# Patient Record
Sex: Male | Born: 1970 | Hispanic: Refuse to answer | Marital: Single | Smoking: Former smoker
Health system: Southern US, Community
[De-identification: ages and names within clinical notes are randomized; demographics above are authoritative.]

## PROBLEM LIST (undated history)

## (undated) DIAGNOSIS — E785 Hyperlipidemia, unspecified: Secondary | ICD-10-CM

## (undated) HISTORY — DX: Hyperlipidemia, unspecified: E78.5

---

## 2010-07-31 ENCOUNTER — Institutional Professional Consult (permissible substitution): Payer: Self-pay | Admitting: Internal Medicine

## 2010-08-19 ENCOUNTER — Telehealth: Payer: Self-pay | Admitting: Internal Medicine

## 2010-08-19 ENCOUNTER — Ambulatory Visit (INDEPENDENT_AMBULATORY_CARE_PROVIDER_SITE_OTHER)
Admission: RE | Admit: 2010-08-19 | Discharge: 2010-08-19 | Disposition: A | Discharge status: Home / Self Care | Payer: 59 | Source: Ambulatory Visit | Attending: Internal Medicine | Admitting: Internal Medicine

## 2010-08-19 ENCOUNTER — Ambulatory Visit (INDEPENDENT_AMBULATORY_CARE_PROVIDER_SITE_OTHER): Payer: 59 | Admitting: Internal Medicine

## 2010-08-19 ENCOUNTER — Encounter: Payer: Self-pay | Admitting: Internal Medicine

## 2010-08-19 ENCOUNTER — Institutional Professional Consult (permissible substitution): Payer: Self-pay | Admitting: Pulmonary Disease

## 2010-08-19 VITALS — BP 124/66 | HR 108 | Temp 98.5°F | Ht 71.0 in | Wt 193.0 lb

## 2010-08-19 DIAGNOSIS — R0609 Other forms of dyspnea: Secondary | ICD-10-CM

## 2010-08-19 DIAGNOSIS — R0600 Dyspnea, unspecified: Secondary | ICD-10-CM

## 2010-08-19 DIAGNOSIS — J45909 Unspecified asthma, uncomplicated: Secondary | ICD-10-CM

## 2010-08-19 DIAGNOSIS — R0989 Other specified symptoms and signs involving the circulatory and respiratory systems: Secondary | ICD-10-CM

## 2010-08-19 DIAGNOSIS — J4540 Moderate persistent asthma, uncomplicated: Secondary | ICD-10-CM | POA: Insufficient documentation

## 2010-08-19 NOTE — Assessment & Plan Note (Addendum)
In retrospect this is most likely longstanding asthma with one episode of  significant work related exacerbation only when he failed to keep his repirator on.  The fv curves are classic for variable  airflow obstruction, there is nl dlco and fev1/vc ratio's are intermittently nl so this is better characterized as chronic asthma than copd  Rec start a maintenance inhaler wiith Qvar 80 Take 2 puffs first thing in am and then another 2 puffs about 12 hours later.    then return for repeat pft's

## 2010-08-19 NOTE — Telephone Encounter (Signed)
OV and PFT scheduled for 09/17/2010. PFT at 9 am and OV with MW at 10 am.

## 2010-08-19 NOTE — Telephone Encounter (Signed)
Please advise Dr. Sherene Sires if okay for pt to follow up in 4 weeks instead of 2 weeks. Pt doesn't want to come in for 2 different visits for PFT and then OV. Thanks  Carver Fila, CMA

## 2010-08-19 NOTE — Progress Notes (Signed)
Subjective:    Patient ID: Willie Coffey, male    DOB: 1970-11-29, 40 y.o.   MRN: [id number]  HPI  63 yobm quit smoking 2002 with no problems at all started working at [employer] in [facility] 2008 with annual spirometry.  08/19/2010 Initial pulmonary office eval in EMR era cc doe x 1 year not progressive, steps most noticeable at work but not home , then failed the workplace spirometry at [employer] >   [facility] "ok to wear respirator".  No cough. Last [facility] eval in November 2011.  Month ago used albuterol but really no better ex tol with it vs without it.   No noct complaints no cough or throat / sinus issues  Pt denies any significant sore throat, dysphagia, itching, sneezing,  nasal congestion or excess/ purulent secretions,  fever, chills, sweats, unintended wt loss, pleuritic or exertional cp, hempoptysis, orthopnea pnd or leg swelling.    Also denies any obvious fluctuation of symptoms with weather or environmental changes or other aggravating or alleviating factors.    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    PMHx New onset doe 2011       -Spirometry 3/92  FEV1  2.08 (56%) ratio 70     - Spirometry 2008  FEV1 2.19 ( 56%)       - Spirometry 07/23/10 FEV1 1.83 (52%) ratio 63 and nl dlco with 21% better p B2 with FRC  70%      - Spirometry  08/19/2010  FEV1 2.47 ( 61%)  with ratio 70 but concave FV curve    Review of Systems  Constitutional: Negative for fever, chills, activity change, appetite change and unexpected weight change.  HENT: Negative for congestion, sore throat, rhinorrhea, sneezing, trouble swallowing, dental problem, voice change and postnasal drip.   Eyes: Negative for visual disturbance.  Respiratory:  Negative for cough, choking and shortness of breath.   Cardiovascular: Negative for chest pain and leg swelling.  Gastrointestinal: Negative for nausea, vomiting and abdominal pain.  Genitourinary: Negative for difficulty urinating.  Musculoskeletal: Negative for arthralgias.  Skin: Negative for rash.  Psychiatric/Behavioral: Negative for behavioral problems and confusion.       Objective:   Physical Exam    pleasant athletic appearing muscular bm nad  Wt 08/19/2010  = 193 who had mod difficulty answering question related to sob  HEENT: nl dentition, turbinates, and orophanx. Nl external ear canals without cough reflex   NECK :  without JVD/Nodes/TM/ nl carotid upstrokes bilaterally   LUNGS: no acc muscle use, clear to A and P bilaterally without cough on insp or exp maneuvers   CV:  RRR  no s3 or murmur or increase in P2, no edema   ABD:  soft and nontender with nl excursion in the supine position. No bruits or organomegaly, bowel sounds nl  MS:  warm without deformities, calf tenderness, cyanosis or clubbing  SKIN: warm and dry without lesions    NEURO:  alert, approp, no deficits    cxr 08/19/2010  Findings: Midline trachea. Normal heart size and mediastinal  contours. Blunting of the left costophrenic angle is likely due to  minimal pleural thickening on the frontal. No pneumothorax. Clear  lungs.  IMPRESSION:  No  acute cardiopulmonary disease.   Assessment & Plan:

## 2010-08-19 NOTE — Telephone Encounter (Signed)
lmomtcb x1 to set up apt and pft for same day

## 2010-08-19 NOTE — Patient Instructions (Addendum)
You appear to have smaller lung volumes than normal on our sprimeter but this is only a preliminary finding and we should be able to correlate this with your cxr which I will call you with and do the formal lung volume test when you return.  In the meantime I see no reason why you can't wear the respirator when you are exposed to heavy fumes dusts or irritants at work  Late Add   Discussed by phone Start qvar 80 Take 2 puffs first thing in am and then another 2 puffs about 12 hours later.    and return with old xrays and pft's May 23rd as planned

## 2010-08-19 NOTE — Telephone Encounter (Signed)
Ok to do single summary ov

## 2010-08-21 MED ORDER — BECLOMETHASONE DIPROPIONATE 80 MCG/ACT IN AERS: INHALATION_SPRAY | RESPIRATORY_TRACT | Status: DC

## 2010-09-05 ENCOUNTER — Encounter: Payer: Self-pay | Admitting: Internal Medicine

## 2010-09-17 ENCOUNTER — Encounter: Payer: Self-pay | Admitting: Internal Medicine

## 2010-09-17 ENCOUNTER — Ambulatory Visit (INDEPENDENT_AMBULATORY_CARE_PROVIDER_SITE_OTHER): Payer: 59 | Admitting: Internal Medicine

## 2010-09-17 DIAGNOSIS — J45909 Unspecified asthma, uncomplicated: Secondary | ICD-10-CM

## 2010-09-17 LAB — PULMONARY FUNCTION TEST

## 2010-09-17 MED ORDER — MOMETASONE FURO-FORMOTEROL FUM 200-5 MCG/ACT IN AERO: INHALATION_SPRAY | RESPIRATORY_TRACT | Status: DC

## 2010-09-17 NOTE — Progress Notes (Signed)
PFT done today. 

## 2010-09-17 NOTE — Patient Instructions (Signed)
Start dulera 200 Take 2 puffs first thing in am and then another 2 puffs about 12 hours later.    Please schedule a follow up office visit in 4 weeks, sooner if needed

## 2010-09-17 NOTE — Progress Notes (Signed)
Subjective:    Patient ID: Willie Coffey, male    DOB: 1970/10/22, 40 y.o.   MRN: [id number]  HPI  26 yobm quit smoking 2002 with no problems at all started working at [employer] in [facility] 2008 with annual spirometry.  08/19/2010 Initial pulmonary office eval in EMR era cc doe x 1 year not progressive, steps most noticeable at work but not home , then failed the workplace spirometry at [employer] >   [facility] "ok to wear respirator".  No cough. Last [facility] eval in November 2011.  Month ago used albuterol but really no better ex tol with it vs without it.   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   Rec:   You appear to have smaller lung volumes than normal on our sprimeter but this is only a preliminary finding and we should be able to correlate this with your cxr which I will call you with and do the formal lung volume test when you return.  In the meantime I see no reason why you can't wear the respirator when you are exposed to heavy fumes dusts or irritants at work  Late Add   Discussed by phone Start qvar 80 Take 2 puffs first thing in am and then another 2 puffs about 12 hours later     09/17/2010 ov/Willie Coffey cc doe x steps only, otherwise dose fine  Sleeping ok without nocturnal  or early am exac of resp c/o's or need for noct saba. Pt denies any significant sore throat, dysphagia, itching, sneezing,  nasal congestion or excess/ purulent secretions,  fever, chills, sweats, unintended wt loss, pleuritic or exertional cp, hempoptysis, orthopnea pnd or leg swelling.    Also denies any obvious fluctuation of symptoms with weather or environmental changes or other  aggravating or alleviating factors.      PMHx New onset doe 2011       -Spirometry 3/92  FEV1  2.08 (56%) ratio 70     -  Spirometry 2008  FEV1 2.19 ( 56%)       - Spirometry   07/23/10 FEV1 1.83 (52%) ratio 63 and nl dlco with 21% better p B2 with FRC  70%      - Spirometry  08/19/2010  FEV1 2.47 ( 61%)  with ratio 70 but concave FV curve       - Spirometry  09/17/10 FEV1 2.20 (54%) with ratio 66 and DLCO 93% but 12 % better p B2       - HFA 75% p coaching 09/17/10           Objective:   Physical Exam    pleasant athletic appearing muscular bm nad    Wt 08/19/2010  = 193  Vs 193 09/17/2010   who had mod difficulty answering question related to sob  HEENT: nl dentition, turbinates, and orophanx. Nl external ear canals without cough reflex   NECK :  without JVD/Nodes/TM/ nl carotid upstrokes bilaterally   LUNGS: no acc muscle use, clear to A and P bilaterally without cough on insp or exp maneuvers   CV:  RRR  no s3 or murmur or increase in P2, no  edema   ABD:  soft and nontender with nl excursion in the supine position. No bruits or organomegaly, bowel sounds nl  MS:  warm without deformities, calf tenderness, cyanosis or clubbing       cxr 08/19/2010  Findings: Midline trachea. Normal heart size and mediastinal  contours. Blunting of the left costophrenic angle is likely due to  minimal pleural thickening on the frontal. No pneumothorax. Clear  lungs.  IMPRESSION:  No acute cardiopulmonary disease.   Assessment & Plan:

## 2010-09-18 ENCOUNTER — Encounter: Payer: Self-pay | Admitting: Internal Medicine

## 2010-09-18 NOTE — Assessment & Plan Note (Addendum)
PFT's nl p bronchodilator x for borderline reduction in lung vol and frc so he has chronic mod asthma by definition that is intermittently symptomatic but needs step up therapy from qvar since failed to normalize  lung function to nl on qvar 80 2bid though symptoms did improve so try dulera 200 2bid x one month trial  DDX of  difficult airways managment all start with A and  include Adherence, Ace Inhibitors, Acid Reflux, Active Sinus Disease, Alpha 1 Antitripsin deficiency, Anxiety masquerading as Airways dz,  ABPA,  allergy(esp in young), Aspiration (esp in elderly), Adverse effects of DPI,  Active smokers, plus two Bs  = Bronchiectasis and Beta blocker use..and one C= CHF  Adherence is always the initial "prime suspect" and is a multilayered concern that requires a "trust but verify" approach in every patient - starting with knowing how to use medications, especially inhalers, correctly, keeping up with refills and understanding the fundamental difference between maintenance and prns vs those medications only taken for a very short course and then stopped and not refilled.    Each maintenance medication was reviewed in detail including most importantly the difference between maintenance and as needed and under what circumstances the prns are to be used.  Please see instructions for details which were reviewed in writing and the patient given a copy.  The proper method of use, as well as anticipated side effects, of this metered-dose inhaler are discussed and demonstrated to the patient. Improved to 75% with coaching

## 2010-09-26 ENCOUNTER — Encounter: Payer: Self-pay | Admitting: Internal Medicine

## 2010-10-30 ENCOUNTER — Ambulatory Visit: Payer: 59 | Admitting: Internal Medicine

## 2010-11-19 ENCOUNTER — Ambulatory Visit: Payer: 59 | Admitting: Internal Medicine

## 2011-01-01 ENCOUNTER — Encounter: Payer: Self-pay | Admitting: Internal Medicine

## 2011-01-01 ENCOUNTER — Ambulatory Visit (INDEPENDENT_AMBULATORY_CARE_PROVIDER_SITE_OTHER): Payer: 59 | Admitting: Internal Medicine

## 2011-01-01 DIAGNOSIS — J45909 Unspecified asthma, uncomplicated: Secondary | ICD-10-CM

## 2011-01-01 DIAGNOSIS — J449 Chronic obstructive pulmonary disease, unspecified: Secondary | ICD-10-CM

## 2011-01-01 NOTE — Progress Notes (Signed)
Subjective:    Patient ID: Willie Coffey, male    DOB: 30-Aug-1970, 40 y.o.   MRN: [id number]  HPI  80 yobm quit smoking 2002 with no problems at all started working at [employer] in [facility] 2008 with annual spirometry.  08/19/2010 Initial pulmonary office eval in EMR era cc doe x 1 year not progressive, steps most noticeable at work but not home , then failed the workplace spirometry at [employer] >   [facility] "ok to wear respirator".  No cough. Last [facility] eval in November 2011.  Month ago used albuterol but really no better ex tol with it vs without it.   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   Rec:  You appear to have smaller lung volumes than normal on our sprimeter but this is only a preliminary finding and we should be able to correlate this with your cxr which I will call you with and do the formal lung volume test when you return. In the meantime I see no reason why you can't wear the respirator when you are exposed to heavy fumes dusts or irritants at work Late Add  Discussed by phone Start qvar 80 Take 2 puffs first thing in am and then another 2 puffs about 12 hours later     09/17/2010 ov/Willie Coffey cc doe x steps only, otherwise dose fine  Sleeping ok without nocturnal  or early am exac of resp c/o's or need for noct saba. rec  Start dulera 200 Take 2 puffs first thing in am and then another 2 puffs about 12 hours later   01/01/2011 f/u ov/Willie Coffey cc works with respirator twice weekly 30 min at a time with no problems at all. No cough or sob  Sleeping ok without nocturnal  or early am exac of resp c/o's or need for noct saba.   Pt denies any significant sore throat,  dysphagia, itching, sneezing,  nasal congestion or excess/ purulent secretions,  fever, chills, sweats, unintended wt loss, pleuritic or exertional cp, hempoptysis, orthopnea pnd or leg swelling.    Also denies any obvious fluctuation of symptoms with weather or environmental changes or other aggravating or alleviating factors.      PMHx New onset doe 2011       -Spirometry 3/92  FEV1  2.08 (56%) ratio 70     -  Spirometry 2008  FEV1 2.19 ( 56%)       - Spirometry   07/23/10 FEV1 1.83 (52%) ratio 63 and nl dlco with 21% better p B2 with FRC  70%      - Spirometry  08/19/2010  FEV1 2.47 ( 61%)  with ratio 70 but concave FV curve       - Spirometry  09/17/10 FEV1 2.20 (54%) with ratio 66 and DLCO 93% but 12 % better p B2       - HFA 75% p coaching 09/17/10           Objective:   Physical Exam    pleasant athletic appearing muscular bm nad    Wt 08/19/2010  = 193  Vs 193 09/17/2010  > 01/01/2011  190  who had mod difficulty answering  question related to sob  HEENT: nl dentition, turbinates, and orophanx. Nl external ear canals without cough reflex   NECK :  without JVD/Nodes/TM/ nl carotid upstrokes bilaterally   LUNGS: no acc muscle use, clear to A and P bilaterally without cough on insp or exp maneuvers   CV:  RRR  no s3 or murmur or increase in P2, no edema   ABD:  soft and nontender with nl excursion in the supine position. No bruits or organomegaly, bowel sounds nl  MS:  warm without deformities, calf tenderness, cyanosis or clubbing       cxr 08/19/2010  Findings: Midline trachea. Normal heart size and mediastinal  contours. Blunting of the left costophrenic angle is likely due to  minimal pleural thickening on the frontal. No pneumothorax. Clear  lungs.  IMPRESSION:  No acute cardiopulmonary disease.   Assessment & Plan:

## 2011-01-01 NOTE — Patient Instructions (Signed)
No change in dulera 200 Take 2 puffs first thing in am and then another 2 puffs about 12 hours later.     Please schedule a follow up visit in 3 months but call sooner if needed with PFT's

## 2011-01-02 ENCOUNTER — Encounter: Payer: Self-pay | Admitting: Internal Medicine

## 2011-01-02 NOTE — Assessment & Plan Note (Signed)
All goals of chronic asthma control met including optimal function and elimination of symptoms with minimal need for rescue therapy.  Contingencies discussed in full including contacting this office immediately if not controlling the symptoms using the rule of two's.    

## 2011-03-02 ENCOUNTER — Telehealth: Payer: Self-pay | Admitting: Internal Medicine

## 2011-03-02 ENCOUNTER — Other Ambulatory Visit: Payer: Self-pay | Admitting: *Deleted

## 2011-03-02 MED ORDER — MOMETASONE FURO-FORMOTEROL FUM 200-5 MCG/ACT IN AERO: INHALATION_SPRAY | RESPIRATORY_TRACT | Status: DC

## 2011-03-02 NOTE — Telephone Encounter (Signed)
lmomtcb  

## 2011-03-02 NOTE — Telephone Encounter (Signed)
Spoke with pt and notified that rx was sent to pharm. He is due next month for rov with PFT's, I asked if we could go ahead and schedule this and he states will have to call back and do this.

## 2011-03-02 NOTE — Telephone Encounter (Signed)
Original rx did not go through electronically. Called in refill. Carron Curie, CMA

## 2011-03-02 NOTE — Telephone Encounter (Signed)
Pt returned the call & can now be reached at [phone].  Willie Coffey

## 2011-04-17 ENCOUNTER — Encounter: Payer: Self-pay | Admitting: Internal Medicine

## 2011-04-17 ENCOUNTER — Ambulatory Visit (INDEPENDENT_AMBULATORY_CARE_PROVIDER_SITE_OTHER): Payer: 59 | Admitting: Internal Medicine

## 2011-04-17 DIAGNOSIS — J45909 Unspecified asthma, uncomplicated: Secondary | ICD-10-CM

## 2011-04-17 MED ORDER — MOMETASONE FURO-FORMOTEROL FUM 200-5 MCG/ACT IN AERO: INHALATION_SPRAY | RESPIRATORY_TRACT | Status: DC

## 2011-04-17 MED ORDER — ALBUTEROL SULFATE HFA 108 (90 BASE) MCG/ACT IN AERS: 2.0000 | INHALATION_SPRAY | Freq: Four times a day (QID) | RESPIRATORY_TRACT | Status: DC | PRN

## 2011-04-17 NOTE — Progress Notes (Signed)
Subjective:    Patient ID: Willie Coffey, male    DOB: 07/28/1970, 40 y.o.   MRN: [id number]  HPI  72 yobm quit smoking 2002 with no problems at all started working at [employer] in [facility] 2008 with annual spirometry.  08/19/2010 Initial pulmonary office eval in EMR era cc doe x 1 year not progressive, steps most noticeable at work but not home , then failed the workplace spirometry at [employer] >   [facility] "ok to wear respirator".  No cough. Last [facility] eval in November 2011.  Month ago used albuterol but really no better ex tol with it vs without it.   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   Rec:  You appear to have smaller lung volumes than normal on our sprimeter but this is only a preliminary finding and we should be able to correlate this with your cxr which I will call you with and do the formal lung volume test when you return. In the meantime I see no reason why you can't wear the respirator when you are exposed to heavy fumes dusts or irritants at work Late Add  Discussed by phone Start qvar 80 Take 2 puffs first thing in am and then another 2 puffs about 12 hours later     09/17/2010 ov/Willie Coffey cc doe x steps only, otherwise dose fine  Sleeping ok without nocturnal  or early am exac of resp c/o's or need for noct saba. rec  Start dulera 200 Take 2 puffs first thing in am and then another 2 puffs about 12 hours later   01/01/2011 f/u ov/Willie Coffey cc works with respirator twice weekly 30 min at a time with no problems at all. No cough or sob rec No change in dulera 200 Take 2 puffs first thing in am and then another 2 puffs about 12 hours later.      04/17/2011 f/u  ov/Willie Coffey still taking dulera 200 2bid and rarely need ventiolin, no cough.  Sleeping ok without nocturnal  or early am exac of resp c/o's or need for noct saba.   Pt denies any significant sore throat, dysphagia, itching, sneezing,  nasal congestion or excess/ purulent secretions,  fever, chills, sweats, unintended wt loss, pleuritic or exertional cp, hempoptysis, orthopnea pnd or leg swelling.    Also denies any obvious fluctuation of symptoms with weather or environmental changes or other aggravating or alleviating factors.      PMHx New onset doe 2011       -Spirometry 3/92  FEV1  2.08 (56%) ratio 70     -  Spirometry 2008  FEV1 2.19 ( 56%)       - Spirometry   07/23/10 FEV1 1.83 (52%) ratio 63 and nl dlco with 21% better p B2 with FRC  70%      - Spirometry  08/19/2010  FEV1 2.47 ( 61%)  with ratio 70 but concave FV curve       - Spirometry  09/17/10 FEV1 2.20 (54%) with ratio 66 and DLCO 93% but 12 % better p B2        -Spiriometry 04/17/2011     2.40 (59%) with ratio 70  and DLCO 88%       - HFA 75% p coaching 09/17/10           Objective:   Physical Exam    pleasant athletic appearing muscular bm nad    Wt 08/19/2010  = 193  Vs 193 09/17/2010  > 01/01/2011  190>   04/17/2011  192  who had mod difficulty answering question related to sob  HEENT: nl dentition, turbinates, and orophanx. Nl external ear canals without cough reflex   NECK :  without JVD/Nodes/TM/ nl carotid upstrokes bilaterally   LUNGS: no acc muscle use, clear to A and P bilaterally without cough on insp or exp maneuvers   CV:  RRR  no s3 or murmur or increase in P2, no edema   ABD:  soft and nontender with nl excursion in the supine position. No bruits or organomegaly, bowel sounds nl  MS:  warm without deformities, calf tenderness, cyanosis or clubbing       cxr 08/19/2010  Findings: Midline trachea. Normal heart size and mediastinal  contours. Blunting of the left costophrenic angle is likely due to    minimal pleural thickening on the frontal. No pneumothorax. Clear  lungs.  IMPRESSION:  No acute cardiopulmonary disease.   Assessment & Plan:

## 2011-04-17 NOTE — Progress Notes (Signed)
PFT done today. 

## 2011-04-17 NOTE — Patient Instructions (Addendum)
You need yearly re-evaluation each November, sooner if need for breathing problems or coughing  Only use your albuterol(ventolin) as a rescue medication to be used if you can't catch your breath by resting or doing a relaxed purse lip breathing pattern. The less you use it, the better it will work when you need it. Ideally you should not need this more than twice a week

## 2011-04-19 NOTE — Assessment & Plan Note (Signed)
All goals of chronic asthma control met including optimal function and elimination of symptoms with minimal need for rescue therapy.  Contingencies discussed in full including contacting this office immediately if not controlling the symptoms using the rule of two's.       Each maintenance medication was reviewed in detail including most importantly the difference between maintenance and as needed and under what circumstances the prns are to be used.  Please see instructions for details which were reviewed in writing and the patient given a copy.   

## 2011-08-31 ENCOUNTER — Telehealth: Payer: Self-pay | Admitting: Internal Medicine

## 2011-08-31 MED ORDER — MOMETASONE FURO-FORMOTEROL FUM 200-5 MCG/ACT IN AERO: INHALATION_SPRAY | RESPIRATORY_TRACT | Status: DC

## 2011-08-31 NOTE — Telephone Encounter (Signed)
I spoke with pt and states he needed a refill on his dulera. I have sent in rx and nothing further was needed

## 2012-04-06 ENCOUNTER — Encounter: Payer: Self-pay | Admitting: Internal Medicine

## 2012-04-06 ENCOUNTER — Ambulatory Visit (INDEPENDENT_AMBULATORY_CARE_PROVIDER_SITE_OTHER): Payer: 59 | Admitting: Internal Medicine

## 2012-04-06 VITALS — BP 102/64 | HR 88 | Temp 97.9°F | Ht 70.0 in | Wt 201.0 lb

## 2012-04-06 DIAGNOSIS — J45909 Unspecified asthma, uncomplicated: Secondary | ICD-10-CM

## 2012-04-06 MED ORDER — MOMETASONE FURO-FORMOTEROL FUM 200-5 MCG/ACT IN AERO: INHALATION_SPRAY | RESPIRATORY_TRACT | Status: DC

## 2012-04-06 NOTE — Progress Notes (Signed)
Subjective:    Patient ID: Willie Coffey, male    DOB: 1970-10-30    MRN: [id number]  HPI  41 yobm quit smoking 2002 with no problems at all started working at [employer] in [facility] 2008 with annual spirometry.  08/19/2010 Initial pulmonary office eval in EMR era cc doe x 1 year not progressive, steps most noticeable at work but not home , then failed the workplace spirometry at [employer] >   [facility] "ok to wear respirator".  No cough. Last [facility] eval in November 2011.  Month ago used albuterol but really no better ex tol with it vs without it.   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   Rec:  You appear to have smaller lung volumes than normal on our spirometer but this is only a preliminary finding and we should be able to correlate this with your cxr which I will call you with and do the formal lung volume test when you return. In the meantime I see no reason why you can't wear the respirator when you are exposed to heavy fumes dusts or irritants at work Late Add  Discussed by phone Start qvar 80 Take 2 puffs first thing in am and then another 2 puffs about 12 hours later     09/17/2010 ov/Willie Coffey cc doe x steps only, otherwise dose fine  Sleeping ok without nocturnal  or early am exac of resp c/o's or need for noct saba. rec  Start dulera 200 Take 2 puffs first thing in am and then another 2 puffs about 12 hours later   01/01/2011 f/u ov/Willie Coffey cc works with respirator twice weekly 30 min at a time with no problems at all. No cough or sob rec No change in dulera 200 Take 2 puffs first thing in am and then another 2 puffs about 12 hours later.      04/17/2011 f/u ov/Willie Coffey still  taking dulera 200 2bid and rarely need ventiolin, no cough. rec You need yearly re-evaluation each November, sooner if need for breathing problems or coughing Only use your albuterol(ventolin) as a rescue medication to be used if you can't catch your breath     04/06/2012 f/u ov/Willie Coffey cc not at all limited by breathing on dulera 200 2 bid and due repeat spirometry in jan 2014  No obvious daytime variabilty or assoc chronic cough or cp or chest tightness, subjective wheeze overt sinus or hb symptoms. No unusual exp hx or h/o childhood pna/ asthma or premature birth to his knowledge.    Sleeping ok without nocturnal  or early am exacerbation  of respiratory  c/o's or need for noct saba. Also denies any obvious fluctuation of symptoms with weather or environmental changes or other aggravating or alleviating factors except as outlined above   ROS  The following are not active complaints unless bolded sore throat, dysphagia, dental problems, itching, sneezing,  nasal congestion or excess/ purulent secretions, ear ache,   fever, chills, sweats, unintended wt loss, pleuritic or exertional cp, hemoptysis,  orthopnea pnd or leg swelling, presyncope, palpitations, heartburn, abdominal pain, anorexia, nausea, vomiting, diarrhea  or change in bowel or urinary habits, change in stools or  urine, dysuria,hematuria,  rash, arthralgias, visual complaints, headache, numbness weakness or ataxia or problems with walking or coordination,  change in mood/affect or memory.          PMHx New onset doe 2011       -Spirometry 3/92  FEV1  2.08 (56%) ratio 70     -  Spirometry 2008  FEV1 2.19 ( 56%)       - Spirometry   07/23/10 FEV1 1.83 (52%) ratio 63 and nl dlco with 21% better p B2 with FRC  70%      - Spirometry  08/19/2010  FEV1 2.47 ( 61%)  with ratio 70 but concave FV curve       - Spirometry  09/17/10 FEV1 2.20 (54%) with ratio 66 and DLCO 93% but 12 % better p B2        -Spiriometry 04/17/2011     2.40 (59%)  with ratio 70 and DLCO 88%       - HFA 90% 04/06/2012           Objective:   Physical Exam    pleasant athletic appearing muscular bm nad    Wt 08/19/2010  = 193  Vs 193 09/17/2010  > 01/01/2011  190>04/17/2011  192> 04/06/2012  201  who had mod difficulty answering question related to sob  HEENT: nl dentition, turbinates, and orophanx. Nl external ear canals without cough reflex   NECK :  without JVD/Nodes/TM/ nl carotid upstrokes bilaterally   LUNGS: no acc muscle use, clear to A and P bilaterally without cough on insp or exp maneuvers   CV:  RRR  no s3 or murmur or increase in P2, no edema   ABD:  soft and nontender with nl excursion in the supine position. No bruits or organomegaly, bowel sounds nl  MS:  warm without deformities, calf tenderness, cyanosis or clubbing       cxr 08/19/2010  Findings: Midline trachea. Normal heart size and mediastinal  contours. Blunting of the left costophrenic angle is likely due to  minimal pleural thickening on the frontal. No pneumothorax. Clear  lungs.  IMPRESSION:  No acute cardiopulmonary disease.   Assessment & Plan:

## 2012-04-06 NOTE — Patient Instructions (Addendum)
Once you have your test in January 2014 (fax to [phone]) start lower strength of dulera 100 Take 2 puffs first thing in am and then another 2 puffs about 12 hours later and if happy with the lower strength we'll refill it.  Work on inhaler technique:  relax and gently blow all the way out then take a nice smooth deep breath back in, triggering the inhaler at same time you start breathing in.  Hold for up to 5 seconds if you can.  Rinse and gargle with water when done   If your mouth or throat starts to bother you,   I suggest you time the inhaler to your dental care and after using the inhaler(s) brush teeth and tongue with a baking soda containing toothpaste and when you rinse this out, gargle with it first to see if this helps your mouth and throat.  We need to see you yearly after your annual lung function test

## 2012-04-06 NOTE — Assessment & Plan Note (Signed)
-  Spirometry 3/92  FEV1  2.08 (56%) ratio 70     - Spirometry 2008  FEV1 2.19 ( 56%)       - Spirometry 07/23/10 FEV1 1.83 (52%) ratio 63 and nl dlco with 21% better p B2      - Spirometry  08/19/2010  FEV1 2.47 ( 61%)  with ratio 70 but concave FV curve        - Spirometry  09/17/10 FEV1 2.20 (54%) with ratio 66 and DLCO 93% but 12 % better p B2       - Spiriometry 04/17/2011     2.40 (59%) with ratio 70 and DLCO 88%       - HFA 90% 04/06/2012  All goals of chronic asthma control met including optimal function and elimination of symptoms with minimal need for rescue therapy.  Contingencies discussed in full including contacting this office immediately if not controlling the symptoms using the rule of two's.      The proper method of use, as well as anticipated side effects, of a metered-dose inhaler are discussed and demonstrated to the patient. Improved effectiveness after extensive coaching during this visit to a level of approximately  90%  Should be able to step down to the dulera 100 p annual work pfts and then just see him yearly thereafter after he does his annual eval.

## 2012-05-18 ENCOUNTER — Other Ambulatory Visit: Payer: Self-pay | Admitting: Internal Medicine

## 2012-12-05 ENCOUNTER — Telehealth: Payer: Self-pay | Admitting: Pulmonary Disease

## 2012-12-05 MED ORDER — MOMETASONE FURO-FORMOTEROL FUM 200-5 MCG/ACT IN AERO: INHALATION_SPRAY | RESPIRATORY_TRACT | Status: DC

## 2012-12-05 NOTE — Telephone Encounter (Signed)
Pt aware RX has been sent. Nothing further was needed 

## 2013-05-30 ENCOUNTER — Other Ambulatory Visit: Payer: Self-pay | Admitting: Internal Medicine

## 2013-05-30 DIAGNOSIS — J45909 Unspecified asthma, uncomplicated: Secondary | ICD-10-CM

## 2013-05-31 ENCOUNTER — Encounter: Payer: Self-pay | Admitting: Internal Medicine

## 2013-05-31 ENCOUNTER — Ambulatory Visit (INDEPENDENT_AMBULATORY_CARE_PROVIDER_SITE_OTHER): Payer: 59 | Admitting: Internal Medicine

## 2013-05-31 VITALS — BP 112/70 | HR 75 | Temp 97.6°F | Ht 70.0 in | Wt 190.0 lb

## 2013-05-31 DIAGNOSIS — J45909 Unspecified asthma, uncomplicated: Secondary | ICD-10-CM

## 2013-05-31 LAB — PULMONARY FUNCTION TEST
DL/VA % PRED: 124 %
DL/VA: 5.78 ml/min/mmHg/L
DLCO unc % pred: 81 %
DLCO unc: 26.47 ml/min/mmHg
FEF 25-75 POST: 1.79 L/s
FEF 25-75 Pre: 1.03 L/sec
FEF2575-%CHANGE-POST: 73 %
FEF2575-%PRED-PRE: 27 %
FEF2575-%Pred-Post: 47 %
FEV1-%Change-Post: 16 %
FEV1-%PRED-PRE: 44 %
FEV1-%Pred-Post: 52 %
FEV1-POST: 2.14 L
FEV1-Pre: 1.84 L
FEV1FVC-%CHANGE-POST: 4 %
FEV1FVC-%Pred-Pre: 85 %
FEV6-%CHANGE-POST: 11 %
FEV6-%Pred-Post: 59 %
FEV6-%Pred-Pre: 53 %
FEV6-PRE: 2.69 L
FEV6-Post: 3.01 L
FEV6FVC-%Change-Post: 0 %
FEV6FVC-%Pred-Post: 102 %
FEV6FVC-%Pred-Pre: 102 %
FVC-%Change-Post: 11 %
FVC-%PRED-POST: 58 %
FVC-%Pred-Pre: 52 %
FVC-Post: 3.04 L
FVC-Pre: 2.72 L
POST FEV1/FVC RATIO: 71 %
POST FEV6/FVC RATIO: 99 %
Pre FEV1/FVC ratio: 68 %
Pre FEV6/FVC Ratio: 99 %
RV % pred: 86 %
RV: 1.66 L
TLC % PRED: 66 %
TLC: 4.59 L

## 2013-05-31 MED ORDER — ALBUTEROL SULFATE HFA 108 (90 BASE) MCG/ACT IN AERS: 2.0000 | INHALATION_SPRAY | Freq: Four times a day (QID) | RESPIRATORY_TRACT | Status: AC | PRN

## 2013-05-31 NOTE — Patient Instructions (Signed)
dulera 200 Take 2 puffs first thing in am and then another 2 puffs about 12 hours later.    Only use your albuterol (proair) as a rescue medication to be used if you can't catch your breath by resting or doing a relaxed purse lip breathing pattern.  - The less you use it, the better it will work when you need it. - Ok to use up to 2 puffs  every 4 hours if you must but call for immediate appointment if use goes up over your usual need - Don't leave home without it !!  (think of it like the spare tire for your car)   Return in one year, call sooner if needed

## 2013-05-31 NOTE — Progress Notes (Signed)
Subjective:    Patient ID: Willie Coffey, male    DOB: 1970/12/17    MRN: [id number]    Brief patient profile:  43yobm quit smoking 2002 with no problems at all started working at [employer] in [facility] 2008 with annual spirometry.   History of Present Illness  08/19/2010 Initial pulmonary office eval in EMR era cc doe x 1 year not progressive, steps most noticeable at work but not home , then failed the workplace spirometry at [employer] >   [facility] "ok to wear respirator".  No cough. Last [facility] eval in November 2011.  Month ago used albuterol but really no better ex tol with it vs without it.   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   Rec:  You appear to have smaller lung volumes than normal on our spirometer but this is only a preliminary finding and we should be able to correlate this with your cxr which I will call you with and do the formal lung volume test when you return. In the meantime I see no reason why you can't wear the respirator when you are exposed to heavy fumes dusts or irritants at work Late Add  Discussed by phone Start qvar 80 Take 2 puffs first thing in am and then another 2 puffs about 12 hours later     09/17/2010 ov/Kadeidra Coryell cc doe x steps only, otherwise dose fine  Sleeping ok without nocturnal  or early am exac of resp c/o's or need for noct saba. rec  Start dulera 200 Take 2 puffs first thing in am and then another 2 puffs about 12 hours later   01/01/2011 f/u ov/Tova Vater cc works with respirator twice weekly 30 min at a time with no problems at all. No cough or sob rec No change in dulera 200 Take 2 puffs first thing in am and then another 2 puffs about  12 hours later.      04/17/2011 f/u ov/Rasheen Bells still taking dulera 200 2bid and rarely need ventiolin, no cough. rec You need yearly re-evaluation each November, sooner if need for breathing problems or coughing Only use your albuterol(ventolin) as a rescue medication to be used if you can't catch your breath     04/06/2012 f/u ov/Janisa Labus cc not at all limited by breathing on dulera 200 2 bid and due repeat spirometry in jan 2014 rec No change rx  05/31/2013 f/u ov/Noheli Melder re: chronic asthma Chief Complaint  Patient presents with  . Followup with PFT    Breathing is overall doing well. Has not needed rescue inhaler recently. No new co's today.    ran out of dulera around a week prior to OV  And has noted increase need for ventolin since but not more that a few times a week and none in last few days  No obvious daytime variabilty or assoc chronic cough or cp or chest tightness, subjective wheeze overt sinus or hb symptoms. No unusual exp hx or h/o childhood pna/ asthma or premature birth to his knowledge.    Sleeping ok without nocturnal  or early am exacerbation  of respiratory  c/o's or need for noct saba. Also denies any obvious  fluctuation of symptoms with weather or environmental changes or other aggravating or alleviating factors except as outlined above   ROS  The following are not active complaints unless bolded sore throat, dysphagia, dental problems, itching, sneezing,  nasal congestion or excess/ purulent secretions, ear ache,   fever, chills, sweats, unintended wt loss, pleuritic or exertional cp, hemoptysis,  orthopnea pnd or leg swelling, presyncope, palpitations, heartburn, abdominal pain, anorexia, nausea, vomiting, diarrhea  or change in bowel or urinary habits, change in stools or urine, dysuria,hematuria,  rash, arthralgias, visual complaints, headache, numbness weakness or ataxia or problems with walking or coordination,  change in mood/affect or memory.          PMHx New  onset doe 2011       -Spirometry 3/92  FEV1  2.08 (56%) ratio 70     -  Spirometry 2008  FEV1 2.19 ( 56%)       - Spirometry   07/23/10 FEV1 1.83 (52%) ratio 63 and nl dlco with 21% better p B2 with FRC  70%      - Spirometry  08/19/2010  FEV1 2.47 ( 61%)  with ratio 70 but concave FV curve       - Spirometry  09/17/10 FEV1 2.20 (54%) with ratio 66 and DLCO 93% but 12 % better p B2        -Spiriometry 04/17/2011     2.40 (59%) with ratio 70 and DLCO 88%       - HFA 90% 04/06/2012           Objective:   Physical Exam    pleasant amb  bm nad    Wt 08/19/2010  = 193  Vs 193 09/17/2010  > 01/01/2011  190>04/17/2011  192> 04/06/2012  201> 05/31/2013  190     HEENT: nl dentition, turbinates, and orophanx. Nl external ear canals without cough reflex   NECK :  without JVD/Nodes/TM/ nl carotid upstrokes bilaterally   LUNGS: no acc muscle use, clear to A and P bilaterally without cough on insp or exp maneuvers   CV:  RRR  no s3 or murmur or increase in P2, no edema   ABD:  soft and nontender with nl excursion in the supine position. No bruits or organomegaly, bowel sounds nl  MS:  warm without deformities, calf tenderness, cyanosis or clubbing       cxr 08/19/2010  Findings: Midline trachea. Normal heart size and mediastinal  contours. Blunting of the left costophrenic angle is likely due to  minimal pleural thickening on the frontal. No pneumothorax. Clear  lungs.  IMPRESSION:  No acute cardiopulmonary disease.   Assessment & Plan:

## 2013-05-31 NOTE — Progress Notes (Signed)
PFT done today. 

## 2013-05-31 NOTE — Assessment & Plan Note (Addendum)
-  Spirometry 3/92  FEV1  2.08 (56%) ratio 70     - Spirometry 2008  FEV1 2.19 ( 56%)       - Spirometry 07/23/10 FEV1 1.83 (52%) ratio 63 and nl dlco with 21% better p B2      - Spirometry  08/19/2010  FEV1 2.47 ( 61%)  with ratio 70 but concave FV curve        - Spirometry  09/17/10 FEV1 2.20 (54%) with ratio 66 and DLCO 93% but 12 % better p B2       - Spiriometry 04/17/2011     2.40 (59%) with ratio 70 and DLCO 88%       - PFT's  05/31/2013    p B2  2.14 (16% improvement with ratio 71) and  dlco 81 off all rx x 5 days prior to OV        - 05/31/2013 p extensive coaching HFA effectiveness =    90%   All goals of chronic asthma control met (when actually taking his meds) including optimal function and elimination of symptoms with minimal need for rescue therapy.  Contingencies discussed in full including contacting this office immediately if not controlling the symptoms using the rule of two's.

## 2013-12-29 ENCOUNTER — Other Ambulatory Visit: Payer: Self-pay | Admitting: Internal Medicine

## 2014-06-19 ENCOUNTER — Ambulatory Visit (INDEPENDENT_AMBULATORY_CARE_PROVIDER_SITE_OTHER)
Admission: RE | Admit: 2014-06-19 | Discharge: 2014-06-19 | Disposition: A | Discharge status: Home / Self Care | Payer: 59 | Source: Ambulatory Visit | Attending: Internal Medicine | Admitting: Internal Medicine

## 2014-06-19 ENCOUNTER — Ambulatory Visit (INDEPENDENT_AMBULATORY_CARE_PROVIDER_SITE_OTHER): Payer: 59 | Admitting: Internal Medicine

## 2014-06-19 ENCOUNTER — Encounter: Payer: Self-pay | Admitting: Internal Medicine

## 2014-06-19 VITALS — BP 112/78 | HR 74 | Ht 70.0 in | Wt 189.0 lb

## 2014-06-19 DIAGNOSIS — D869 Sarcoidosis, unspecified: Secondary | ICD-10-CM

## 2014-06-19 DIAGNOSIS — J4540 Moderate persistent asthma, uncomplicated: Secondary | ICD-10-CM

## 2014-06-19 NOTE — Assessment & Plan Note (Signed)
-  Spirometry 3/92  FEV1  2.08 (56%) ratio 70     - Spirometry 2008  FEV1 2.19 ( 56%)       - Spirometry 07/23/10 FEV1 1.83 (52%) ratio 63 and nl dlco with 21% better p B2      - Spirometry  08/19/2010  FEV1 2.47 ( 61%)  with ratio 70 but concave FV curve        - Spirometry  09/17/10 FEV1 2.20 (54%) with ratio 66 and DLCO 93% but 12 % better p B2       - Spiriometry 04/17/2011     2.40 (59%) with ratio 70 and DLCO 88%       - PFT's  05/31/2013    p B2  2.14 (16% improvement with ratio 71) and  dlco 81       - 06/19/2014 p extensive coaching HFA effectiveness =    75%     All goals of chronic asthma control met including optimal function and elimination of symptoms with minimal need for rescue therapy.  Contingencies discussed in full including contacting this office immediately if not controlling the symptoms using the rule of two's.

## 2014-06-19 NOTE — Assessment & Plan Note (Addendum)
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

## 2014-06-19 NOTE — Progress Notes (Signed)
Subjective:    Patient ID: Willie Coffey, male    DOB: February 27, 1971    MRN: [id number]    Brief patient profile:  44 yobm quit smoking 2002 with no problems at all started working at [employer] in [facility] 2008 with annual spirometry showing obst pattern and prob sarcoid dx 06/19/2014    History of Present Illness  08/19/2010 Initial pulmonary office eval in EMR era cc doe x 1 year not progressive, steps most noticeable at work but not home , then failed the workplace spirometry at [employer] >   [facility] "ok to wear respirator".  No cough. Last [facility] eval in November 2011.  Month ago used albuterol but really no better ex tol with it vs without it.   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   Rec:  You appear to have smaller lung volumes than normal on our spirometer but this is only a preliminary finding and we should be able to correlate this with your cxr which I will call you with and do the formal lung volume test when you return. In the meantime I see no reason why you can't wear the respirator when you are exposed to heavy fumes dusts or irritants at work Late Add  Discussed by phone Start qvar 80 Take 2 puffs first thing in am and then another 2 puffs about 12 hours later     09/17/2010 ov/[name] cc doe x steps only, otherwise dose fine  Sleeping ok without nocturnal  or early am exac of resp c/o's or need for noct saba. rec  Start dulera 200 Take 2 puffs first thing in am and then another 2 puffs about 12 hours later   01/01/2011 f/u ov/[name] cc works with respirator twice weekly 30 min at a time with no problems at all. No cough or sob rec No change in dulera 200 Take 2  puffs first thing in am and then another 2 puffs about 12 hours later.      05/31/2013 f/u ov/[name] re: chronic asthma Chief Complaint  Patient presents with  . Followup with PFT    Breathing is overall doing well. Has not needed rescue inhaler recently. No new co's today.    ran out of dulera around a week prior to OV  And has noted increase need for ventolin since but not more that a few times a week and none in last few days rec dulera 200 Take 2 puffs first thing in am and then another 2 puffs about 12 hours later.  Only use your albuterol (proair) as a rescue med    06/19/2014 f/u ov/[name] re: chronic asthma/ ? Sarcoid related uveitis  Chief Complaint  Patient presents with  . Follow-up    Pt states that his breathing is doing well. No need for rescue inhaler.   new onset photosensitivity > eye eval > uveitis  Doing great on dulera 200 2 bid / never use saba   No obvious daytime variabilty or assoc chronic cough or cp or chest tightness, subjective wheeze overt sinus or hb symptoms. No unusual exp hx or h/o childhood pna/ asthma or premature birth to his knowledge.  Sleeping ok without nocturnal  or early am exacerbation  of respiratory  c/o's or need for noct saba. Also denies any obvious fluctuation of symptoms with weather or environmental changes or other aggravating or alleviating factors except as outlined above   ROS  The following are not active complaints unless bolded sore throat, dysphagia, dental problems, itching, sneezing,  nasal congestion or excess/ purulent secretions, ear ache,   fever, chills, sweats, unintended wt loss, pleuritic or exertional cp, hemoptysis,  orthopnea pnd or leg swelling, presyncope, palpitations, heartburn, abdominal pain, anorexia, nausea, vomiting, diarrhea  or change in bowel or urinary habits, change in stools or urine, dysuria,hematuria,  rash, arthralgias, visual complaints, headache, numbness weakness or ataxia or problems with walking or  coordination,  change in mood/affect or memory.          PMHx New onset doe 2011       -Spirometry 3/92  FEV1  2.08 (56%) ratio 70     -  Spirometry 2008  FEV1 2.19 ( 56%)       - Spirometry   07/23/10 FEV1 1.83 (52%) ratio 63 and nl dlco with 21% better p B2 with FRC  70%      - Spirometry  08/19/2010  FEV1 2.47 ( 61%)  with ratio 70 but concave FV curve       - Spirometry  09/17/10 FEV1 2.20 (54%) with ratio 66 and DLCO 93% but 12 % better p B2        -Spiriometry 04/17/2011     2.40 (59%) with ratio 70 and DLCO 88%       - HFA 90% 04/06/2012           Objective:   Physical Exam    pleasant amb  bm nad off inhalers x one week     Wt 08/19/2010  = 193  Vs 193 09/17/2010  > 01/01/2011  190>04/17/2011  192> 04/06/2012  201> 06/19/2014   189  190     HEENT: nl dentition, turbinates, and orophanx. Nl external ear canals without cough reflex   NECK :  without JVD/Nodes/TM/ nl carotid upstrokes bilaterally   LUNGS: no acc muscle use, mild  insp and exp sonorous rhonchi bilaterally    CV:  RRR  no s3 or murmur or increase in P2, no edema   ABD:  soft and nontender with nl excursion in the supine position. No bruits or organomegaly, bowel sounds nl  MS:  warm without deformities, calf tenderness, cyanosis or clubbing         CXR PA and Lateral:   06/19/2014 :     I personally reviewed images and agree with radiology impression as follows:    There is bilateral hilar lymphadenopathy slightly more conspicuous than on the previous study  Assessment & Plan:

## 2014-06-19 NOTE — Patient Instructions (Addendum)
Ok to try dulera 200 at 1 puff every 12 hours to save money but at the first sign of any worsening immediately increase back to 2 every 12 hours  Only use your albuterol as a rescue medication to be used if you can't catch your breath by resting or doing a relaxed purse lip breathing pattern.  - The less you use it, the better it will work when you need it. - Ok to use up to 2 puffs  every 4 hours if you must but call for immediate appointment if use goes up over your usual need - Don't leave home without it !!  (think of it like the spare tire for your car)   Please remember to go to the  x-ray department downstairs for your tests - we will call you with the results when they are available.  Your eye drops may need to be adjusted if your breathing gets worse as they contain timoptic that partially blocks your dulera   Follow up can be yearly as long as you are doing well  - late add: since he probably has sarcoidosis rec dulera 2 bid consistently then return in 3 m for pfts

## 2014-06-20 ENCOUNTER — Other Ambulatory Visit: Payer: Self-pay | Admitting: Internal Medicine

## 2014-06-20 DIAGNOSIS — D869 Sarcoidosis, unspecified: Secondary | ICD-10-CM

## 2014-06-20 NOTE — Progress Notes (Signed)
Quick Note:  [facility] ______ 

## 2014-06-21 MED ORDER — MOMETASONE FURO-FORMOTEROL FUM 200-5 MCG/ACT IN AERO: INHALATION_SPRAY | RESPIRATORY_TRACT | Status: AC

## 2014-06-21 NOTE — Progress Notes (Signed)
Quick Note:  Spoke with pt and notified of results per Dr. Wert. Pt verbalized understanding and denied any questions.  ______ 

## 2014-12-26 ENCOUNTER — Ambulatory Visit: Payer: 59 | Admitting: Internal Medicine

## 2015-02-12 ENCOUNTER — Ambulatory Visit (INDEPENDENT_AMBULATORY_CARE_PROVIDER_SITE_OTHER): Payer: 59 | Admitting: Internal Medicine

## 2015-02-12 ENCOUNTER — Ambulatory Visit (INDEPENDENT_AMBULATORY_CARE_PROVIDER_SITE_OTHER)
Admission: RE | Admit: 2015-02-12 | Discharge: 2015-02-12 | Disposition: A | Discharge status: Home / Self Care | Payer: 59 | Source: Ambulatory Visit | Attending: Internal Medicine | Admitting: Internal Medicine

## 2015-02-12 ENCOUNTER — Encounter: Payer: Self-pay | Admitting: Internal Medicine

## 2015-02-12 VITALS — BP 120/78 | HR 83 | Ht 70.0 in | Wt 186.4 lb

## 2015-02-12 DIAGNOSIS — J189 Pneumonia, unspecified organism: Secondary | ICD-10-CM | POA: Diagnosis not present

## 2015-02-12 DIAGNOSIS — J4540 Moderate persistent asthma, uncomplicated: Secondary | ICD-10-CM

## 2015-02-12 DIAGNOSIS — D869 Sarcoidosis, unspecified: Secondary | ICD-10-CM | POA: Diagnosis not present

## 2015-02-12 NOTE — Progress Notes (Signed)
Subjective:    Patient ID: Willie Coffey, male    DOB: Apr 26, 1971    MRN: [id number]    Brief patient profile:  44 yobm quit smoking 2002 with no problems at all started working at [employer] [facility] [employer] 2008 with annual spirometry showing obst pattern and prob sarcoid dx 06/19/2014    History of Present Illness  08/19/2010 Initial pulmonary office eval in EMR era cc doe x 1 year not progressive, steps most noticeable at work but not home , then failed the workplace spirometry at [employer] >   [facility] > [facility] "ok to wear respirator".  No cough. Last [facility] eval in November 2011.  Month ago used albuterol but really no better ex tol with it vs without it.   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   Rec:  You appear to have smaller lung volumes than normal on our spirometer but this is only a preliminary finding and we should be able to correlate this with your cxr which I will call you with and do the formal lung volume test when you return. In the meantime I see no reason why you can't wear the respirator when you are exposed to heavy fumes dusts or irritants at work Late Add  Discussed by phone Start qvar 80 Take 2 puffs first thing in am and then another 2 puffs about 12 hours later     09/17/2010 ov/Wert cc doe x steps only, otherwise dose fine  Sleeping ok without nocturnal  or early am exac of resp c/o's or need for noct saba. rec  Start dulera 200 Take 2 puffs first thing in am and then another 2 puffs about 12 hours later   01/01/2011 f/u ov/Wert cc works with respirator twice weekly 30 min at a time with no problems at all. No cough or sob rec No change in dulera 200 Take 2  puffs first thing in am and then another 2 puffs about 12 hours later.      05/31/2013 f/u ov/Wert re: chronic asthma Chief Complaint  Patient presents with  . Followup with PFT    Breathing is overall doing well. Has not needed rescue inhaler recently. No new co's today.    ran out of dulera around a week prior to OV  And has noted increase need for ventolin since but not more that a few times a week and none in last few days rec dulera 200 Take 2 puffs first thing in am and then another 2 puffs about 12 hours later.  Only use your albuterol (proair) as a rescue med    06/19/2014 f/u ov/Wert re: chronic asthma/ ? Sarcoid related uveitis  Chief Complaint  Patient presents with  . Follow-up    Pt states that his breathing is doing well. No need for rescue inhaler.   new onset photosensitivity > eye eval > uveitis  Doing great on dulera 200 2 bid / never use saba  rec Ok to try dulera 200 at 1 puff every 12 hours to save money but at the first sign of any worsening immediately increase back to 2 every 12 hours Only use your albuterol as a rescue  medication  Your eye drops may need to be adjusted if your breathing gets worse as they contain timoptic that partially blocks your dulera  Follow up can be yearly as long as you are doing well  - late add: since he probably has sarcoidosis rec dulera 200 2 bid consistently then return in 3 m for pfts > did not do   02/12/2015  f/u ov/Wert re: sarcoid/ asthma/ does not know names of meds, confused with dates "what's the month before August?"  Chief Complaint  Patient presents with  . Follow-up    pt following for asthma: pt states he is doing pretty good.  pt states he just got over pnuemonia. no c/o SOB, Chest tightness or cough.   Back to nl level, still can't jog like he wants but otherwise Not limited by breathing from desired activities   [facility]    No obvious daytime variabilty or assoc chronic cough or cp or chest  tightness, subjective wheeze overt sinus or hb symptoms. No unusual exp hx or h/o childhood pna/ asthma or premature birth to his knowledge.    Sleeping ok without nocturnal  or early am exacerbation  of respiratory  c/o's or need for noct saba. Also denies any obvious fluctuation of symptoms with weather or environmental changes or other aggravating or alleviating factors except as outlined above   ROS  The following are not active complaints unless bolded sore throat, dysphagia, dental problems, itching, sneezing,  nasal congestion or excess/ purulent secretions, ear ache,   fever, chills, sweats, unintended wt loss, pleuritic or exertional cp, hemoptysis,  orthopnea pnd or leg swelling, presyncope, palpitations, heartburn, abdominal pain, anorexia, nausea, vomiting, diarrhea  or change in bowel or urinary habits, change in stools or urine, dysuria,hematuria,  rash, arthralgias, visual complaints, headache, numbness weakness or ataxia or problems with walking or coordination,  change in mood/affect or memory.          PMHx New onset doe 2011       -Spirometry 3/92  FEV1  2.08 (56%) ratio 70     -  Spirometry 2008  FEV1 2.19 ( 56%)       - Spirometry   07/23/10 FEV1 1.83 (52%) ratio 63 and nl dlco with 21% better p B2 with FRC  70%      - Spirometry  08/19/2010  FEV1 2.47 ( 61%)  with ratio 70 but concave FV curve       - Spirometry  09/17/10 FEV1 2.20 (54%) with ratio 66 and DLCO 93% but 12 % better p B2        -Spiriometry 04/17/2011     2.40 (59%) with ratio 70 and DLCO 88%       - HFA 90% 04/06/2012           Objective:   Physical Exam    pleasant amb  bm      Wt 08/19/2010  = 193  Vs 193 09/17/2010  > 01/01/2011  190>04/17/2011  192> 04/06/2012  201> 06/19/2014   189  190 > 02/12/2015   186     HEENT: nl dentition, turbinates, and orophanx. Nl external ear canals without cough reflex   NECK :  without JVD/Nodes/TM/ nl carotid upstrokes bilaterally   LUNGS: no acc muscle use,   mild insp and exp sonorous rhonchi bilaterally    CV:  RRR  no s3 or murmur or increase in P2, no edema   ABD:  soft and  nontender with nl excursion in the supine position. No bruits or organomegaly, bowel sounds nl  MS:  warm without deformities, calf tenderness, cyanosis or clubbing    CXR PA and Lateral:   02/12/2015 :    I personally reviewed images and agree with radiology impression as follows:    Re- demonstrated fullness of hila bilaterally suggestive of adenopathy versus dilated pulmonary arteries. This may potentially be secondary to patient's history of sarcoidosis. Consider correlation with cross-sectional imaging as clinically indicated.  No acute cardiopulmonary process.       Assessment & Plan:   Outpatient Encounter Prescriptions as of 02/12/2015  Medication Sig  . albuterol (PROAIR HFA) 108 (90 BASE) MCG/ACT inhaler Inhale 2 puffs into the lungs every 6 (six) hours as needed for wheezing or shortness of breath. 2 puffs every 4 hours as needed only  if your can't catch your breath  . atorvastatin (LIPITOR) 10 MG tablet Take 1 tablet by mouth daily.  . brimonidine (ALPHAGAN) 0.2 % ophthalmic solution Apply 1 drop to eye 3 (three) times daily.  . mometasone-formoterol (DULERA) 200-5 MCG/ACT AERO INHALE 2 PUFFS BY MOUTH FIRST THING IN THE MORNING, AND THEN INHALE ANOTHER 2 PUFFS ABOUT 12 HOURS LATER  . timolol (TIMOPTIC) 0.5 % ophthalmic solution Apply 1 drop to eye 2 (two) times daily.  . metFORMIN (GLUCOPHAGE-XR) 500 MG 24 hr tablet Take 500 mg by mouth daily.   No facility-administered encounter medications on file as of 02/12/2015.

## 2015-02-12 NOTE — Progress Notes (Signed)
Quick Note:  [facility] ______ 

## 2015-02-12 NOTE — Patient Instructions (Addendum)
Continue dulera 200 Take 2 puffs first thing in am and then another 2 puffs about 12 hours later.   Only use your albuterol as a rescue medication to be used if you can't catch your breath by resting or doing a relaxed purse lip breathing pattern.  - The less you use it, the better it will work when you need it. - Ok to use up to 2 puffs  every 4 hours if you must but call for immediate appointment if use goes up over your usual need - Don't leave home without it !!  (think of it like the spare tire for your car)  Please remember to go to the  x-ray department downstairs for your tests - we will call you with the results when they are available.    Please see patient coordinator before you leave today  [facility]

## 2015-02-13 ENCOUNTER — Telehealth: Payer: Self-pay | Admitting: Internal Medicine

## 2015-02-13 ENCOUNTER — Encounter: Payer: Self-pay | Admitting: Internal Medicine

## 2015-02-13 NOTE — Assessment & Plan Note (Addendum)
-  Spirometry 3/92  FEV1  2.08 (56%) ratio 70     - Spirometry 2008  FEV1 2.19 ( 56%)       - Spirometry 07/23/10 FEV1 1.83 (52%) ratio 63 and nl dlco with 21% better p B2      - Spirometry  08/19/2010  FEV1 2.47 ( 61%)  with ratio 70 but concave FV curve        - Spirometry  09/17/10 FEV1 2.20 (54%) with ratio 66 and DLCO 93% but 12 % better p B2       - Spiriometry 04/17/2011     2.40 (59%) with ratio 70 and DLCO 88%       - PFT's  05/31/2013   FEV1  p B2  2.14 (16% improvement with ratio 71) and  dlco 81         - 02/12/2015 p extensive coaching HFA effectiveness =   90%  From a baseline of 75%   Despite continued use of timoptic, All goals of chronic asthma control met including optimal function and elimination of symptoms with minimal need for rescue therapy.  Contingencies discussed in full    My concern is that he gets all his care except pulmonary at [facility] and is extremely easily confused with details of care > best option is establish with the pulmonary clinic, too, esp since he lives in [place]  I had an extended summary final discussion with the patient reviewing all relevant studies completed to date and  lasting 15 to 20 minutes of a 25 minute visit    Each maintenance medication was reviewed in detail including most importantly the difference between maintenance and prns and under what circumstances the prns are to be triggered using an action plan format that is not reflected in the computer generated alphabetically organized AVS.    Please see instructions for details which were reviewed in writing and the patient given a copy highlighting the part that I personally wrote and discussed at today's ov.

## 2015-02-13 NOTE — Assessment & Plan Note (Signed)
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

## 2015-02-13 NOTE — Telephone Encounter (Signed)
Notes Recorded by Nyoka CowdenMichael B Wert, MD on 02/12/2015 at 11:57 AM Call pt: Reviewed cxr and no evidence pna but still some sarcoid changes they [facility] but no change rx in interim ----------------------------- Spoke with pt, aware of results/recs.  Nothing further needed.

## 2015-02-13 NOTE — Assessment & Plan Note (Signed)
See cxr 12/26/14 [facility]  - f/u cxr 02/12/2015 > no as dz/ no f/u for this problem needed as resolved clinically and radiographically

## 2017-08-11 IMAGING — DX DG CHEST 2V
2 series · 2 of 2 positions shown · non-contrast
Comparison: Chest radiograph 06/19/2014; 08/19/2010

CLINICAL DATA: Follow-up evaluation for history of pneumonia.

EXAM:
CHEST  2 VIEW

[chest pa]
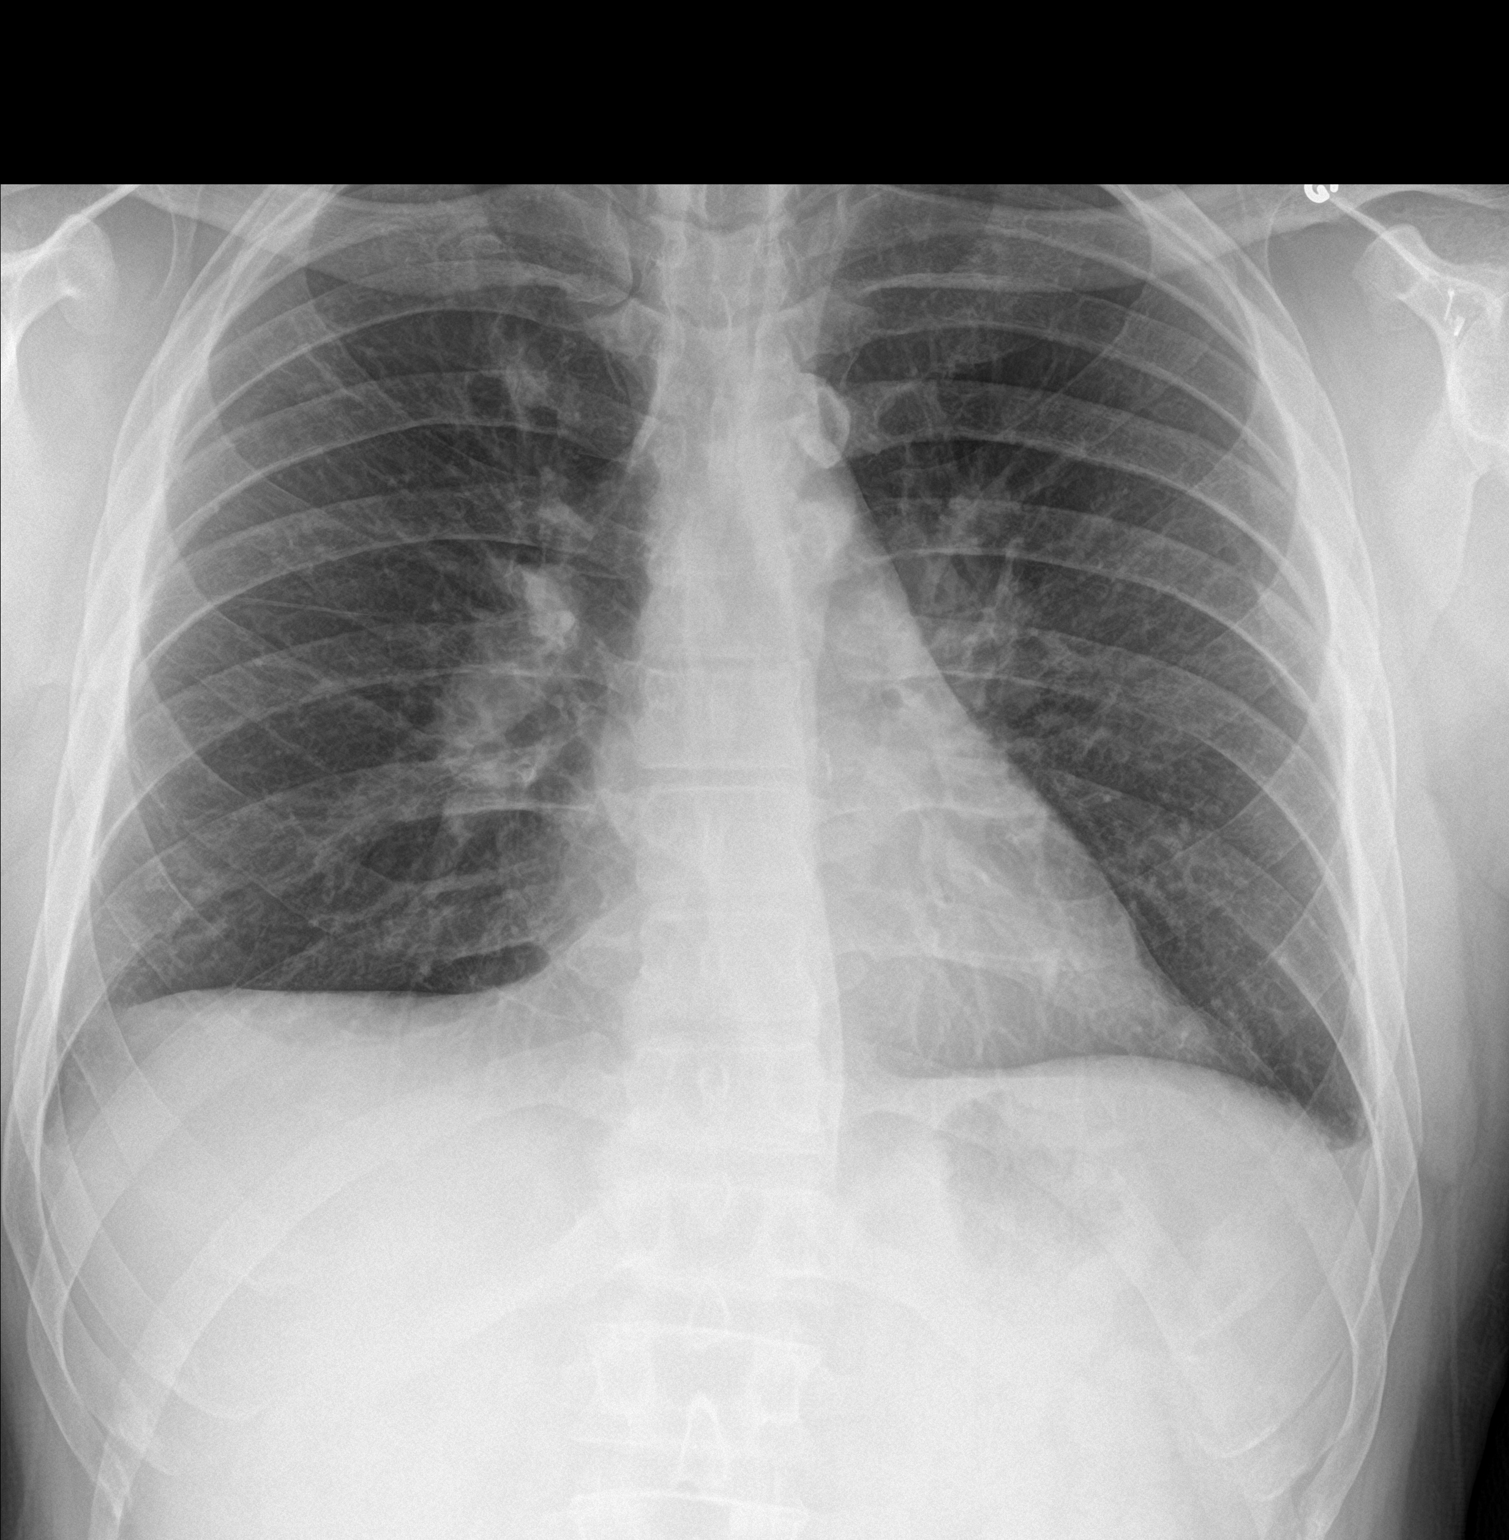

[chest lat]
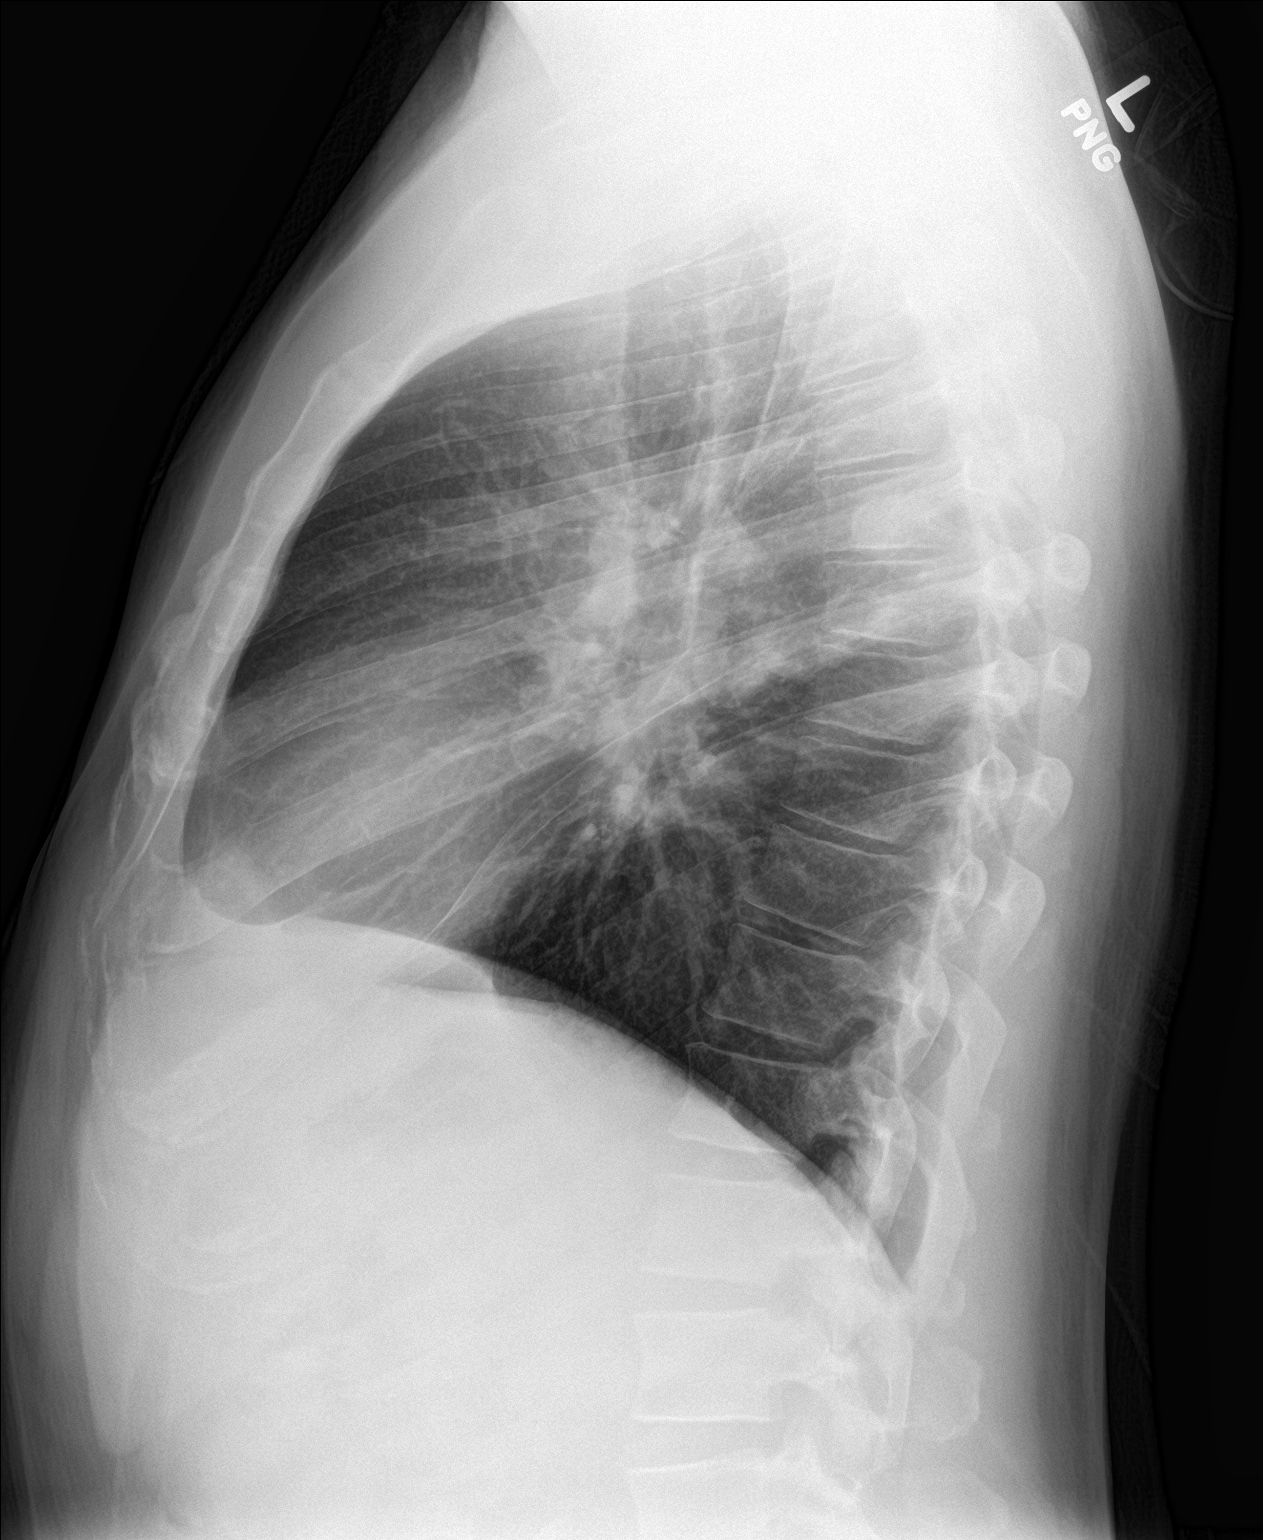

[2 of 2 positions shown; findings below may reference images not displayed]

FINDINGS: Stable cardiac contours. Persistent fullness of the hila
bilaterally. No consolidative pulmonary opacity. No pleural effusion
or pneumothorax. Regional skeleton is unremarkable.
IMPRESSION: Re- demonstrated fullness of hila bilaterally suggestive of
adenopathy versus dilated pulmonary arteries. This may potentially
be secondary to patient's history of sarcoidosis. Consider
correlation with cross-sectional imaging as clinically indicated.

No acute cardiopulmonary process.
